# Patient Record
Sex: Male | Born: 1955 | Race: Black or African American | Hispanic: No | Smoking: Never smoker
Health system: Southern US, Community
[De-identification: ages and names within clinical notes are randomized; demographics above are authoritative.]

## PROBLEM LIST (undated history)

## (undated) DIAGNOSIS — I10 Essential (primary) hypertension: Secondary | ICD-10-CM

---

## 2000-12-02 ENCOUNTER — Encounter: Payer: Self-pay | Admitting: Emergency Medicine

## 2000-12-02 ENCOUNTER — Emergency Department (HOSPITAL_COMMUNITY): Admission: EM | Admit: 2000-12-02 | Discharge: 2000-12-02 | Payer: Self-pay | Admitting: Emergency Medicine

## 2000-12-03 ENCOUNTER — Emergency Department (HOSPITAL_COMMUNITY): Admission: EM | Admit: 2000-12-03 | Discharge: 2000-12-03 | Payer: Self-pay | Admitting: Emergency Medicine

## 2006-03-12 ENCOUNTER — Emergency Department (HOSPITAL_COMMUNITY): Admission: EM | Admit: 2006-03-12 | Discharge: 2006-03-12 | Payer: Self-pay | Admitting: Emergency Medicine

## 2006-04-03 ENCOUNTER — Emergency Department (HOSPITAL_COMMUNITY): Admission: EM | Admit: 2006-04-03 | Discharge: 2006-04-03 | Payer: Self-pay | Admitting: Emergency Medicine

## 2007-08-20 ENCOUNTER — Emergency Department (HOSPITAL_COMMUNITY): Admission: EM | Admit: 2007-08-20 | Discharge: 2007-08-20 | Payer: Self-pay | Admitting: Emergency Medicine

## 2008-09-11 IMAGING — CR DG LUMBAR SPINE COMPLETE 4+V
5 series · 5 of 5 positions shown · non-contrast
Comparison: none

CLINICAL DATA: Trauma.  Fall off ladder.
LUMBAR SPINE ? 5 VIEW:

[t l-spine a.p.]
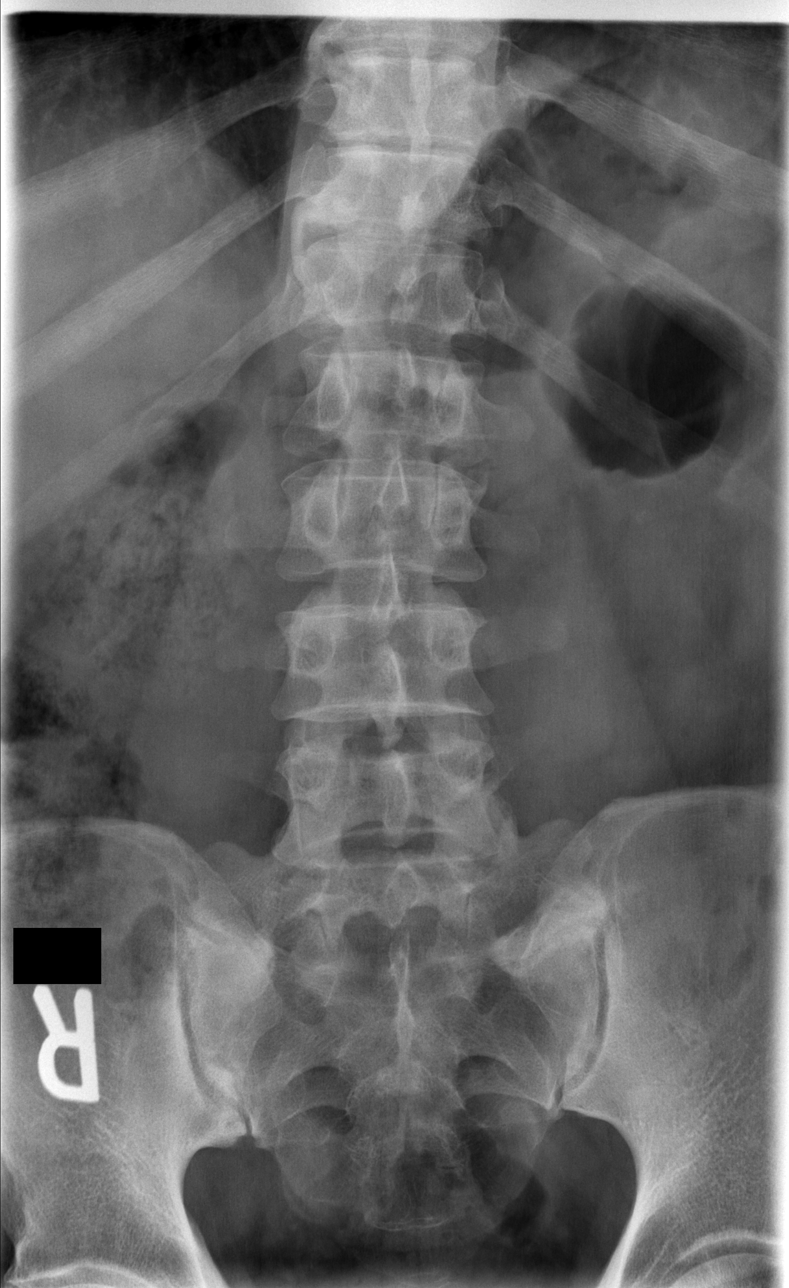

[t l-spine oblique exposure (1 of 2)]
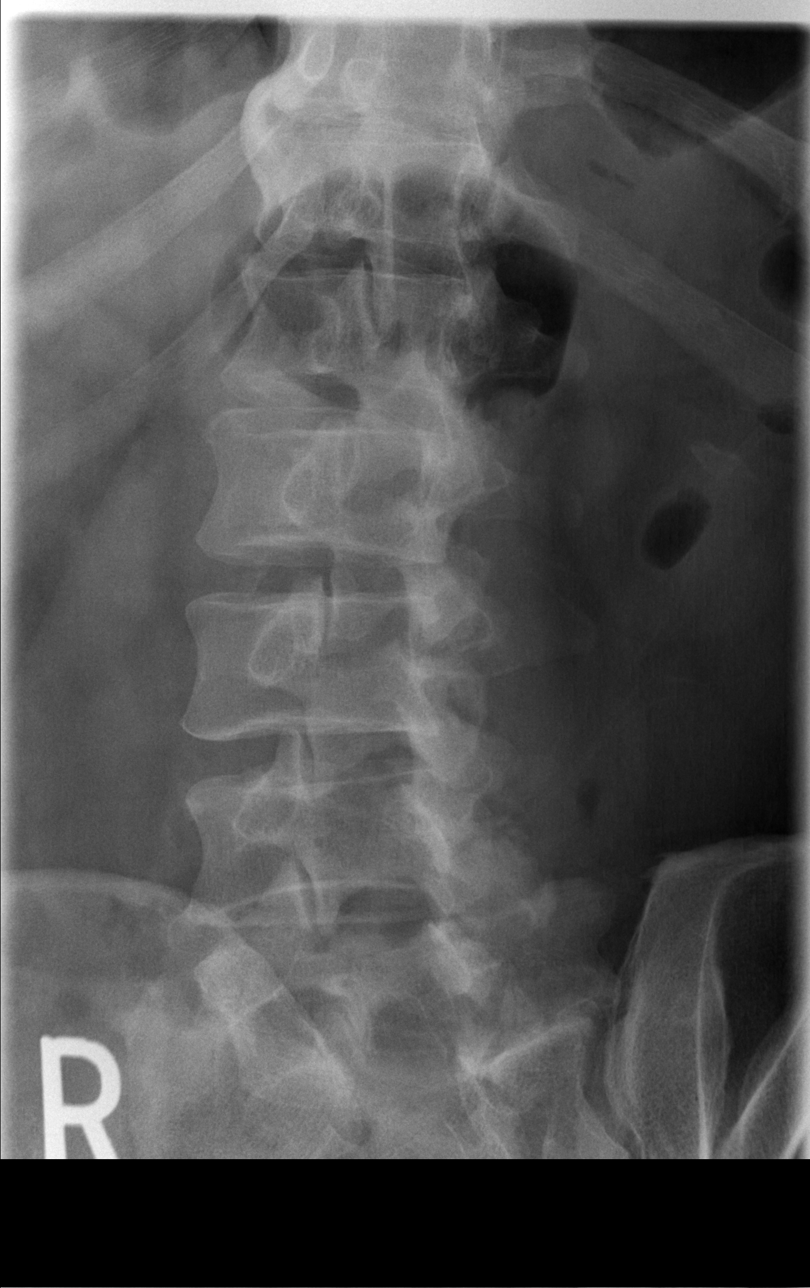

[t l-spine oblique exposure (2 of 2)]
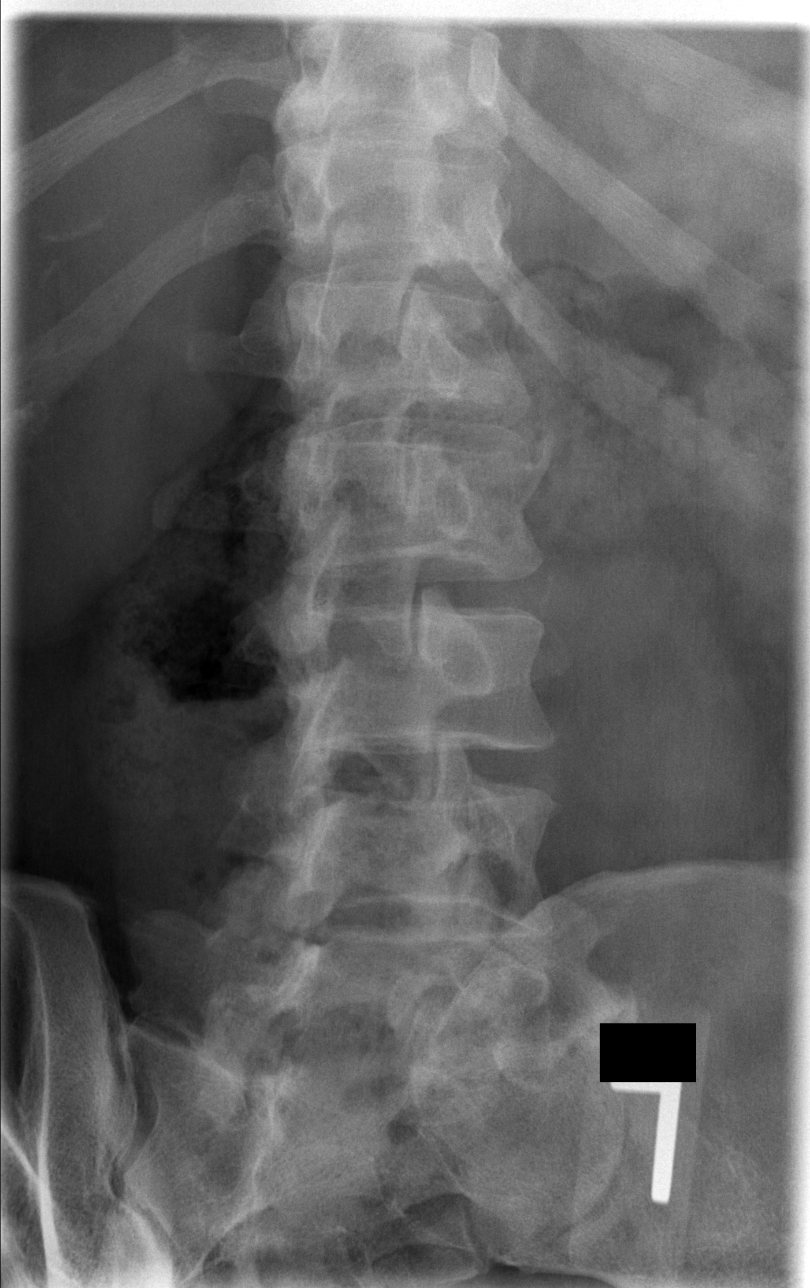

[t l-spine lat]
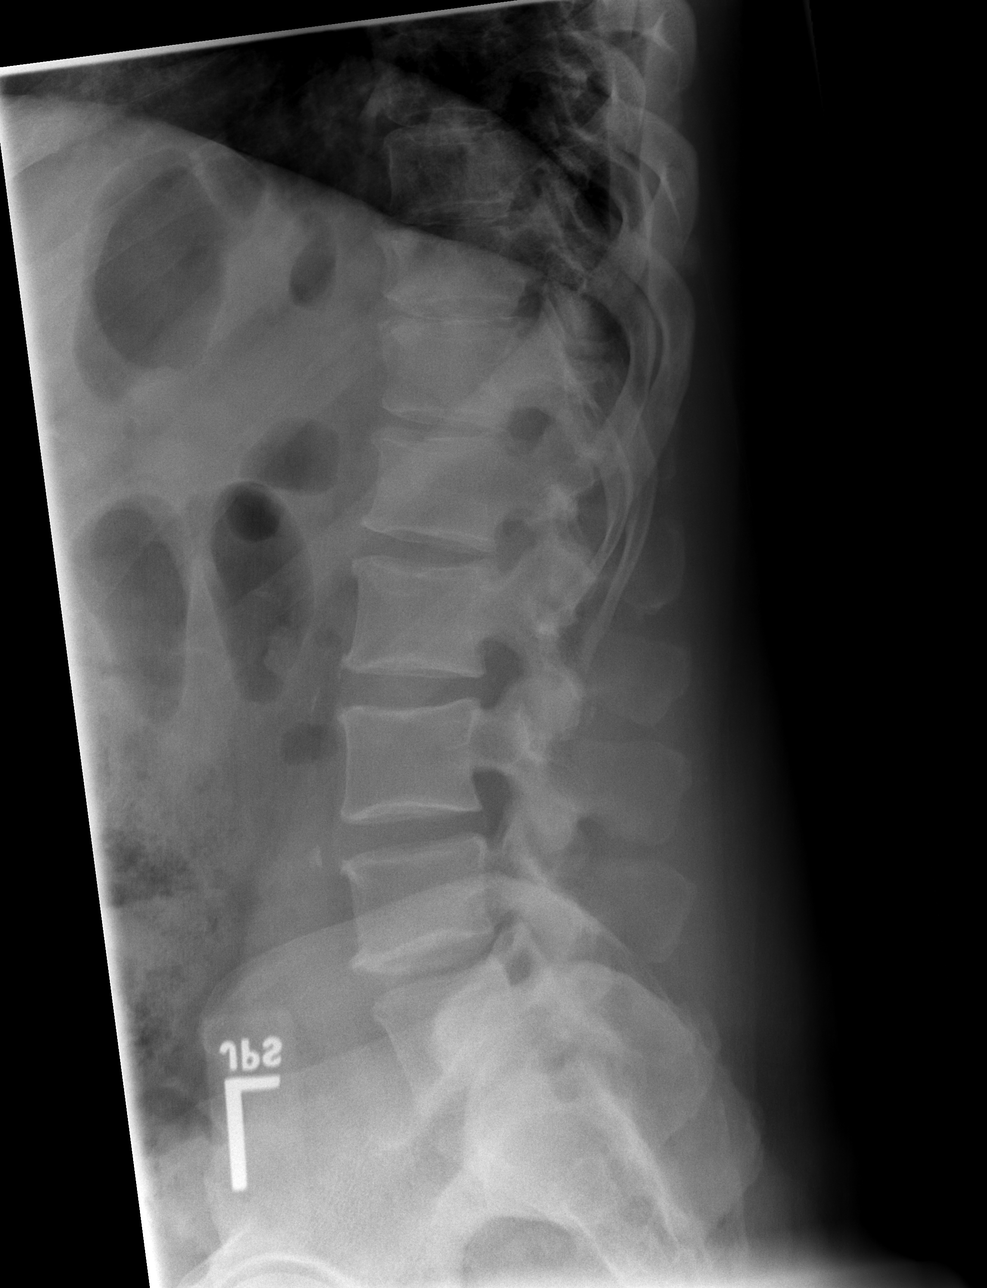

[t l-spine l5-s1 spot]
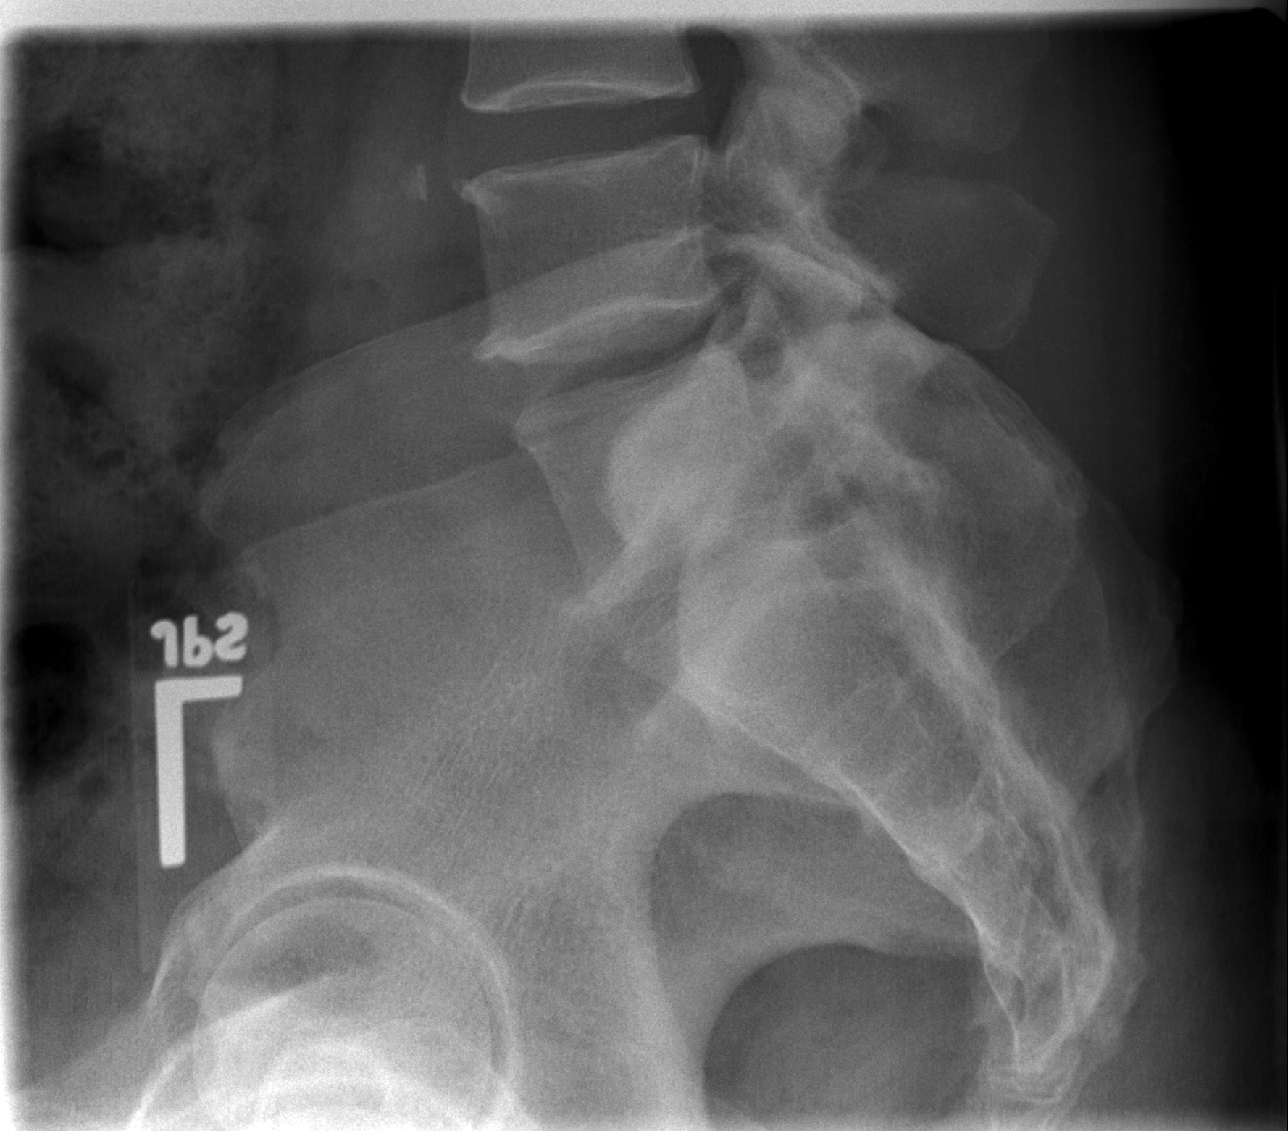

[5 of 5 positions shown; findings below may reference images not displayed]

FINDINGS: There are five lumbar type vertebrae.  o evidence of fracture or subluxation.  Normal vertebral body alignment.  Oblique views demonstrate no pars defect.
IMPRESSION: No evidence of fracture or subluxation of the lumbar spine.

## 2009-02-17 ENCOUNTER — Emergency Department (HOSPITAL_COMMUNITY): Admission: EM | Admit: 2009-02-17 | Discharge: 2009-02-17 | Payer: Self-pay | Admitting: Emergency Medicine

## 2011-03-02 LAB — BRAIN NATRIURETIC PEPTIDE: Pro B Natriuretic peptide (BNP): <30 pg/mL (ref 0.0–100.0)

## 2011-03-02 LAB — CBC
HCT: 48.6 % (ref 39.0–52.0)
Hemoglobin: 16.8 g/dL (ref 13.0–17.0)
MCHC: 34.5 g/dL (ref 30.0–36.0)
MCV: 98.5 fL (ref 78.0–100.0)
Platelets: 200 10*3/uL (ref 150–400)
RBC: 4.93 MIL/uL (ref 4.22–5.81)
RDW: 13.5 % (ref 11.5–15.5)
WBC: 5.1 10*3/uL (ref 4.0–10.5)

## 2011-03-02 LAB — DIFFERENTIAL
Basophils Absolute: 0 10*3/uL (ref 0.0–0.1)
Basophils Relative: 1 % (ref 0–1)
Eosinophils Absolute: 0.1 10*3/uL (ref 0.0–0.7)
Eosinophils Relative: 2 % (ref 0–5)
Lymphocytes Relative: 43 % (ref 12–46)
Lymphs Abs: 2.2 10*3/uL (ref 0.7–4.0)
Monocytes Absolute: 0.6 10*3/uL (ref 0.1–1.0)
Monocytes Relative: 12 % (ref 3–12)
Neutro Abs: 2.2 10*3/uL (ref 1.7–7.7)
Neutrophils Relative %: 43 % (ref 43–77)

## 2011-03-02 LAB — BASIC METABOLIC PANEL
BUN: 11 mg/dL (ref 6–23)
CO2: 26 mEq/L (ref 19–32)
Calcium: 9.8 mg/dL (ref 8.4–10.5)
Chloride: 103 mEq/L (ref 96–112)
Creatinine, Ser: 1.19 mg/dL (ref 0.4–1.5)
GFR calc Af Amer: >60 mL/min (ref >60)
GFR calc non Af Amer: >60 mL/min (ref >60)
Glucose, Bld: 88 mg/dL (ref 70–99)
Potassium: 3.7 mEq/L (ref 3.5–5.1)
Sodium: 137 mEq/L (ref 135–145)

## 2011-03-02 LAB — POCT CARDIAC MARKERS
CKMB, poc: 1.9 ng/mL (ref 1.0–8.0)
Myoglobin, poc: 107 ng/mL (ref 12–200)
Troponin i, poc: <0.05 ng/mL (ref 0.00–0.09)

## 2012-08-18 ENCOUNTER — Emergency Department (HOSPITAL_COMMUNITY)
Admission: EM | Admit: 2012-08-18 | Discharge: 2012-08-18 | Disposition: A | Discharge status: Home / Self Care | Payer: Self-pay | Attending: Emergency Medicine | Admitting: Emergency Medicine

## 2012-08-18 ENCOUNTER — Encounter (HOSPITAL_COMMUNITY): Payer: Self-pay | Admitting: Emergency Medicine

## 2012-08-18 DIAGNOSIS — I10 Essential (primary) hypertension: Secondary | ICD-10-CM | POA: Insufficient documentation

## 2012-08-18 DIAGNOSIS — Z880 Allergy status to penicillin: Secondary | ICD-10-CM | POA: Insufficient documentation

## 2012-08-18 DIAGNOSIS — S61409A Unspecified open wound of unspecified hand, initial encounter: Secondary | ICD-10-CM | POA: Insufficient documentation

## 2012-08-18 DIAGNOSIS — S61431A Puncture wound without foreign body of right hand, initial encounter: Secondary | ICD-10-CM

## 2012-08-18 DIAGNOSIS — S61411A Laceration without foreign body of right hand, initial encounter: Secondary | ICD-10-CM

## 2012-08-18 DIAGNOSIS — W268XXA Contact with other sharp object(s), not elsewhere classified, initial encounter: Secondary | ICD-10-CM | POA: Insufficient documentation

## 2012-08-18 HISTORY — DX: Essential (primary) hypertension: I10

## 2012-08-18 MED ORDER — LIDOCAINE HCL (PF) 1 % IJ SOLN: 5.0000 mL | Freq: Once | INTRAMUSCULAR | Status: DC

## 2012-08-18 NOTE — ED Provider Notes (Signed)
Medical screening examination/treatment/procedure(s) were performed by non-physician practitioner and as supervising physician I was immediately available for consultation/collaboration.  Derwood Kaplan, MD 08/18/12 [phone]
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
## 2012-08-18 NOTE — ED Notes (Signed)
Pt reports puncture wound to area between right thumb and forefinger when hand went down on nail. Pt poured peroxide in in and has it bandaged; bleeding controlled. Pt had tetanus shot at [facility] hospital. Sensation and movement intact.

## 2021-11-07 ENCOUNTER — Emergency Department
Admission: EM | Admit: 2021-11-07 | Discharge: 2021-11-07 | Disposition: A | Discharge status: Home / Self Care | Payer: Medicare (Managed Care) | Attending: Emergency Medicine | Admitting: Emergency Medicine

## 2021-11-07 ENCOUNTER — Encounter: Payer: Self-pay | Admitting: Emergency Medicine

## 2021-11-07 ENCOUNTER — Emergency Department: Payer: Medicare (Managed Care)

## 2021-11-07 ENCOUNTER — Other Ambulatory Visit: Payer: Self-pay

## 2021-11-07 DIAGNOSIS — S43402A Unspecified sprain of left shoulder joint, initial encounter: Secondary | ICD-10-CM | POA: Diagnosis not present

## 2021-11-07 DIAGNOSIS — Y92410 Unspecified street and highway as the place of occurrence of the external cause: Secondary | ICD-10-CM | POA: Insufficient documentation

## 2021-11-07 DIAGNOSIS — S4992XA Unspecified injury of left shoulder and upper arm, initial encounter: Secondary | ICD-10-CM | POA: Diagnosis present

## 2021-11-07 DIAGNOSIS — I10 Essential (primary) hypertension: Secondary | ICD-10-CM | POA: Diagnosis not present

## 2021-11-07 DIAGNOSIS — Z79899 Other long term (current) drug therapy: Secondary | ICD-10-CM | POA: Insufficient documentation

## 2021-11-07 MED ORDER — IBUPROFEN 400 MG PO TABS
400.0000 mg | ORAL_TABLET | Freq: Once | ORAL | Status: AC
  Administered 2021-11-07: 14:00:00 400 mg via ORAL
  Filled 2021-11-07: qty 1
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
## 2021-11-07 NOTE — ED Notes (Signed)
Pt discharged home after verbalizing understanding of discharge instructions; nad noted.
